# Patient Record
Sex: Female | Born: 2005
Health system: Southern US, Community
[De-identification: ages and names within clinical notes are randomized; demographics above are authoritative.]

## PROBLEM LIST (undated history)

## (undated) ENCOUNTER — Emergency Department (HOSPITAL_BASED_OUTPATIENT_CLINIC_OR_DEPARTMENT_OTHER): Payer: PRIVATE HEALTH INSURANCE

## (undated) HISTORY — PX: TYMPANOSTOMY TUBE PLACEMENT: SHX32

---
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
## 2024-02-09 NOTE — ED Provider Notes (Signed)
 Chief Complaint: Hematuria (Pt endorses blood coming from her urethra starting yesterday.  Pt reports no pain with urination.  Pt felt like there was a bubble in my lower abdomen.  Pt states she is nauseous without V/D.  )   HPI:   Sonya Patel is a 18 y.o. female who presents to the ED Complaining of urethral bleeding.  Patient states that she had her menses last week and it is slowly dwindled off.  However last night after sexual intercourse the patient started having some increased bleeding.  She believes that this is coming from her urethra.  She denies dysuria.  States that she felt like she had a bubble in her belly yesterday but now has no discomfort.  She states that the bleeding was worse last night and has eased off today.  The bleeding did start after intercourse last night.  She denies it being forceful.  No instrumentation.  Patient is not on birth control.   History provided by:  Patient Language interpreter used: No   Hematuria Irritative symptoms do not include frequency or urgency. Pertinent negatives include no dysuria, fever or flank pain.     History:   PMH: No past medical history on file. No past surgical history on file.  MEDS:   No current facility-administered medications for this encounter. No current outpatient medications on file.  ALLERGIES: Patient has no known allergies.  SOCHX: Social History   Socioeconomic History   Marital status: Single    FAMHX: History reviewed. No pertinent family history.   Review of Systems:   Review of Systems  Constitutional:  Negative for fever.  Genitourinary:  Positive for hematuria. Negative for difficulty urinating, dysuria, flank pain, frequency, pelvic pain, urgency and vaginal discharge.  All other systems reviewed and are negative.    All other systems are negative. I have reviewed the nurses notes and vitals.   Physical Exam:   Vitals:  Vitals:   02/09/24 0710 02/09/24 0715 02/09/24 0745   BP: 120/70 120/70 116/68  Pulse: 67    Resp: 16    Temp: 36.8 C (98.2 F)    TempSrc: Oral    SpO2: 98%  99%  Weight: 77.1 kg (170 lb)    Height: 188 cm (6' 2)       Physical Exam Vitals and nursing note reviewed. Exam conducted with a chaperone present.  Constitutional:      General: She is not in acute distress.    Appearance: Normal appearance. She is not ill-appearing or toxic-appearing.  HENT:     Head: Normocephalic and atraumatic.     Right Ear: External ear normal.     Left Ear: External ear normal.     Nose: Nose normal.     Mouth/Throat:     Mouth: Mucous membranes are moist.     Pharynx: Oropharynx is clear. No oropharyngeal exudate or posterior oropharyngeal erythema.  Eyes:     General: No scleral icterus.       Right eye: No discharge.        Left eye: No discharge.     Extraocular Movements: Extraocular movements intact.     Pupils: Pupils are equal, round, and reactive to light.  Cardiovascular:     Rate and Rhythm: Normal rate and regular rhythm.     Pulses: Normal pulses.     Heart sounds: Normal heart sounds.  Pulmonary:     Effort: Pulmonary effort is normal. No respiratory distress.     Breath sounds:  Normal breath sounds. No wheezing, rhonchi or rales.  Chest:     Chest wall: No tenderness.  Abdominal:     General: Abdomen is flat. There is no distension.     Palpations: Abdomen is soft.     Tenderness: There is abdominal tenderness (very minimal with deep palpation of suprpubic area). There is no guarding or rebound.  Genitourinary:    General: Normal vulva.     Vagina: No vaginal discharge.     Comments: Vaginal exam performed.  No lacerations or lesions noted.  Cervix is closed.  There was a very minimal amount of clotted blood in her vault that was easily removed.  No active bleeding at this time.  No urethral tears or bleeding. Musculoskeletal:        General: No deformity. Normal range of motion.     Cervical back: Normal range of  motion and neck supple.     Right lower leg: No edema.     Left lower leg: No edema.  Skin:    General: Skin is warm.     Capillary Refill: Capillary refill takes less than 2 seconds.     Coloration: Skin is not pale.  Neurological:     General: No focal deficit present.     Mental Status: She is alert and oriented to person, place, and time. Mental status is at baseline.  Psychiatric:        Mood and Affect: Mood normal.        Behavior: Behavior normal.        Thought Content: Thought content normal.      Labs/Radiology:   No orders to display    Labs Reviewed  URINALYSIS WITH MICROSCOPY - Abnormal; Notable for the following components:      Result Value   Clarity, UA Cloudy (*)    Specific Gravity, UA 1.023 (*)    Leukocyte Esterase, UA 1+ (*)    Protein, UA Trace (*)    Blood, UA 3+ (*)    RBC, UA 910 (*)    WBC, UA 25 (*)    Squam Epithel, UA 31 (*)    Bacteria, UA Rare (*)    All other components within normal limits  PREGNANCY, URINE - Normal    No results found for this visit on 02/09/24 (from the past 4464 hours).   I independently reviewed and interpreted all lab work and imaging that was obtained in the ED, as noted below. Procedures:   Procedures  Medications - No data to display   ED Course:  Patient resting in bed.  After discussion is when we found out that the patient's bleeding did start after intercourse last night.  She denies of being forced for any type of instrumentation.  She states that she had her menses earlier in the week and thought that it had stopped.  However after intercourse last night she started having some increased bleeding.  This is not happened before.  On exam she has very minimal tenderness to suprapubic palpation.  Patient agreed to a vaginal exam.  External structures are intact.  There is no bleeding or tears near the urethra.  We did do a speculum exam which showed a very minimal amount of clotted blood in her vault.   Cervix was visualized.  This was closed.  No active bleeding or discharge.  No cervical motion tenderness.  No vaginal lacerations or lesions noted.  I did have a long discussion with the patient about multiple  different reasons for abnormal uterine bleeding.  She is not currently on birth control.  The bleeding has stopped at this time.  We did discuss CT for further evaluation of this secondary to being after colitis.  We also discussed lab work to assess for anemia.  Patient does not want either of these at this time.  Urinalysis does appear to be contaminated.  She has no dysuria this is not urethral bleeding so I do not believe that this is urinary tract infection.  She states she is not soaking through a pad an hour front to back side-to-side.  We did discuss following up with OB/GYN.  We also discussed that she could follow-up with at family medicine for similar.  We also discussed she could return if the symptoms come back.  At this time she would like to be discharged home.  Will discharge patient home in stable condition.  ED Course as of 02/09/24 0808  Sun Feb 09, 2024  0757 Squam Epithel, UA(!): 31  0757 Leukocyte Esterase, UA(!): 1+ contaminated      Impression:    Final diagnoses:  Vaginal bleeding (Primary)      Medical Decision Making Patient resting in bed.  No tachycardia.  No hypotension.  Low suspicion for anemia and patient will not allow us  to get blood at this time.  Patient with incredibly minimal pain to palpation of her suprapubic region.  Initially she states she does not have any discomfort but after further questioning she states may be some mild discomfort.  She otherwise is well-appearing.  Vaginal exam is reassuring.  No vaginal lacerations or cervical lesions.  No continued bleeding.  No adnexal tenderness.  Low suspicion for ovarian torsion, laceration, severe anemia.  We did discuss the possibility of fibroid bleeding and abnormal uterine bleeding.  We  discussed the possibility of anemia.  I do not believe that this is urinary tract infection as she has no dysuria and this is vaginal bleeding.  Patient does appear stable for outpatient follow-up.  Amount and/or Complexity of Data Reviewed Labs: ordered. Decision-making details documented in ED Course.  Risk OTC drugs. Prescription drug management.     The patient appears reasonably screened and/or stabilized for discharge, and I doubt any other medical condition or other [facility] requiring further screening, evaluation, or treatment in the ED at this time prior to discharge.    Pertinent labs & imaging results that were available during my care of the patient were reviewed by me and considered in my medical decision making (see chart for details).  Diagnosis and care plan discussed. All questions were answered.    This note was transcribed using Dragon voice recognition software, in a noisy emergency department, and may contain inadvertent misspellings or incorrect transcriptions.    Sharl Barnie Jansky, DO 02/09/24 [phone]

## 2024-11-23 ENCOUNTER — Ambulatory Visit (HOSPITAL_BASED_OUTPATIENT_CLINIC_OR_DEPARTMENT_OTHER)

## 2024-12-03 ENCOUNTER — Encounter (HOSPITAL_BASED_OUTPATIENT_CLINIC_OR_DEPARTMENT_OTHER): Payer: Self-pay

## 2024-12-03 ENCOUNTER — Ambulatory Visit (HOSPITAL_BASED_OUTPATIENT_CLINIC_OR_DEPARTMENT_OTHER)

## 2024-12-03 VITALS — BP 110/76 | HR 78 | Ht 71.0 in | Wt 163.0 lb

## 2024-12-03 DIAGNOSIS — M25361 Other instability, right knee: Secondary | ICD-10-CM

## 2024-12-03 DIAGNOSIS — F419 Anxiety disorder, unspecified: Secondary | ICD-10-CM

## 2024-12-03 DIAGNOSIS — Z7689 Persons encountering health services in other specified circumstances: Secondary | ICD-10-CM

## 2024-12-03 DIAGNOSIS — M25362 Other instability, left knee: Secondary | ICD-10-CM

## 2024-12-03 MED ORDER — HYDROXYZINE PAMOATE 25 MG PO CAPS: 25.0000 mg | ORAL_CAPSULE | Freq: Three times a day (TID) | ORAL | 0 refills | Status: AC | PRN

## 2024-12-03 NOTE — Progress Notes (Signed)
 "   New Patient Office Visit  Subjective:   Sonya Patel 2006-06-25 12/03/2024  Chief Complaint  Patient presents with   New Patient (Initial Visit)    Patient is here to get established with PCP. Patient states she would like her legs examined due to bump on her leg. Also would like anxiety medication.    HPI: Sonya Patel presents today to establish care at [facility]. Introduced to publishing rights manager role and practice setting with verbalized understanding by patient.  All questions answered.   Sonya Patel goes to [employer] and is leaving for [place] in 2 weeks to study abroad for 4 months.   Last PCP: 2023 Last annual physical: 2023 Concerns: See below   Bilateral knee instability:  States she has been told there is nothing that can be done. However, she feels she is unable to do her normal activities due to bilateral knee instability with hx of dislocations. Is not interested in getting a second opinion today.  Anxiety: States she thinks she might need medication for her anxiety. States it is not always present and she has leanred out to cope with her symptoms for the most part. However, she does experience instances when she feels her anxiety is  more overwhelming in her day to day life. Denies SI/HI.    The following portions of the patient's history were reviewed and updated as appropriate: past medical history, past surgical history, family history, social history, allergies, medications, and problem list.   Patient Active Problem List   Diagnosis Date Noted   Dysmenorrhea 11/02/2021   Tonsil stone 11/02/2021   Left supracondylar humerus fracture 01/22/2014   History reviewed. No pertinent past medical history. Past Surgical History:  Procedure Laterality Date   FRACTURE SURGERY     ORIF ELBOW FRACTURE Left 01/21/2014   Procedure: OPEN REDUCTION INTERNAL FIXATION (ORIF) ELBOW Suprachondylar humerus  Fracture;  Surgeon: Sonya VEAR Bruch, MD;  Location: [facility] OR;  Service: Orthopedics;  Laterality: Left;   TYMPANOSTOMY TUBE PLACEMENT     Family History  Problem Relation Age of Onset   Anxiety disorder Mother    ADD / ADHD Mother    Diabetes Maternal Grandfather    Hearing loss Maternal Grandfather    Depression Sister    Anxiety disorder Sister    ADD / ADHD Sister    Social History   Socioeconomic History   Marital status: Single    Spouse name: Not on file   Number of children: Not on file   Years of education: Not on file   Highest education level: 12th grade  Occupational History   Not on file  Tobacco Use   Smoking status: Never   Smokeless tobacco: Not on file  Substance and Sexual Activity   Alcohol use: Not Currently   Drug use: Never   Sexual activity: Yes    Birth control/protection: Condom  Other Topics Concern   Not on file  Social History Narrative   Not on file   Social Drivers of Health   Tobacco Use: Unknown (12/03/2024)   Patient History    Smoking Tobacco Use: Never    Smokeless Tobacco Use: Unknown    Passive Exposure: Not on file  Financial Resource Strain: Low Risk (12/02/2024)   Overall Financial Resource Strain (CARDIA)    Difficulty of Paying Living Expenses: Not hard at all  Food Insecurity: No Food Insecurity (12/02/2024)   Epic    Worried  About Running Out of Food in the Last Year: Never true    Ran Out of Food in the Last Year: Never true  Transportation Needs: No Transportation Needs (12/02/2024)   Epic    Lack of Transportation (Medical): No    Lack of Transportation (Non-Medical): No  Physical Activity: Sufficiently Active (12/02/2024)   Exercise Vital Sign    Days of Exercise per Week: 5 days    Minutes of Exercise per Session: 40 min  Stress: No Stress Concern Present (12/02/2024)   Sonya Patel [employer] Occupational [employer] - Occupational Stress Questionnaire    Feeling of Stress: Only a little  Social Connections: Moderately Isolated  (12/02/2024)   Social Connection and Isolation Panel    Frequency of Communication with Friends and Family: More than three times a week    Frequency of Social Gatherings with Friends and Family: Twice a week    Attends Religious Services: Never    Database Administrator or Organizations: Yes    Attends Engineer, Structural: More than 4 times per year    Marital Status: Never married  Intimate Partner Violence: Not At Risk (12/03/2024)   Epic    Fear of Current or Ex-Partner: No    Emotionally Abused: No    Physically Abused: No    Sexually Abused: No  Depression (PHQ2-9): Low Risk (12/03/2024)   Depression (PHQ2-9)    PHQ-2 Score: 2  Alcohol Screen: Low Risk (12/02/2024)   Alcohol Screen    Last Alcohol Screening Score (AUDIT): 5  Housing: High Risk (12/02/2024)   Epic    Unable to Pay for Housing in the Last Year: No    Number of Times Moved in the Last Year: 3    Homeless in the Last Year: No  Utilities: Not At Risk (12/03/2024)   Epic    Threatened with loss of utilities: No  Health Literacy: Not on file   Outpatient Medications Prior to Visit  Medication Sig Dispense Refill   ibuprofen  (ADVIL ,MOTRIN ) 100 MG/5ML suspension Take 5 mg/kg by mouth every 6 (six) hours as needed.     acetaminophen -codeine  120-12 MG/5ML solution Take 5 mLs by mouth every 4 (four) hours as needed for moderate pain. (Patient not taking: Reported on 12/03/2024) 200 mL 1   No facility-administered medications prior to visit.   Allergies[1]  ROS: A complete ROS was performed with pertinent positives/negatives noted in the HPI. The remainder of the ROS are negative.   Objective:   Today's Vitals   12/03/24 1047  BP: 110/76  Pulse: 78  SpO2: 100%  Weight: 163 lb (73.9 kg)  Height: 5' 11 (1.803 m)    GENERAL: Well-appearing, in NAD. Well nourished. RESPIRATORY: Chest wall symmetrical. Respirations even and non-labored. Breath sounds clear to auscultation bilaterally.  CARDIAC: S1, S2 present,  regular rate and rhythm without murmur or gallops. Peripheral pulses 2+ bilaterally.  MSK: Muscle tone and strength appropriate for age. Joints w/o tenderness, redness, or swelling.  EXTREMITIES: Without clubbing, cyanosis, or edema.  NEUROLOGIC: No motor or sensory deficits. Steady, even gait. C2-C12 intact.  PSYCH/MENTAL STATUS: Alert, oriented x 3. Cooperative, appropriate mood and affect.    Health Maintenance Due  Topic Date Due   CHLAMYDIA SCREENING  Never done   HPV VACCINES (1 - 3-dose series) Never done   HIV Screening  Never done   Meningococcal B Vaccine (1 of 2 - Standard) Never done   Hepatitis C Screening  Never done   DTaP/Tdap/Td (1 -  Tdap) Never done   Hepatitis B Vaccines 19-59 Average Risk (1 of 3 - 19+ 3-dose series) Never done   Influenza Vaccine  Never done   COVID-19 Vaccine (1 - 2025-26 season) Never done    No results found for any visits on 12/03/24.     Assessment & Plan:  1. Encounter to establish care with new doctor (Primary) Discussed role of NP and expectations of the Primary Care [facility]. Discussed medical, surgical, and family history.   2. Anxiety Discussed ordering medication on an as needed basis. Discussed trying this medication for the next two weeks to see if it is beneficial. If so, she will then have [place] to take abroad to help treat her anxiety. Pt to follow up closely with PCP which she verbalized understanding. - hydrOXYzine  (VISTARIL ) 25 MG capsule; Take 1 capsule (25 mg total) by mouth every 8 (eight) hours as needed for anxiety.  Dispense: 30 capsule; Refill: 0  3. Patellar instability of both knees Discussed my willingness to offer the patient a referral for a second opinion. I discussed with the patient that while she has a history continued problems, in my opinion, this is not something she should have to miss out on activities for. While she was not interested in seeking out a second opinion at this time, I told her to keep PCP  updated if that changes. Pt verbalized understanding.  Patient to reach out to office if new, worrisome, or unresolved symptoms arise or if no improvement in patient's condition. Patient verbalized understanding and is agreeable to treatment plan. All questions answered to patient's satisfaction.    Return in about 8 days (around 12/11/2024) for annual physical (fasting labs same day).    Lauraine Almarie Angus DNP, FNP-C      [1] No Known Allergies  "

## 2024-12-15 ENCOUNTER — Encounter (HOSPITAL_BASED_OUTPATIENT_CLINIC_OR_DEPARTMENT_OTHER)
